# Patient Record
Sex: Female | Born: 1965 | Race: White | Hispanic: No | Marital: Married | Smoking: Never smoker
Health system: Southern US, Community
[De-identification: ages and names within clinical notes are randomized; demographics above are authoritative.]

## PROBLEM LIST (undated history)

## (undated) DIAGNOSIS — K219 Gastro-esophageal reflux disease without esophagitis: Secondary | ICD-10-CM

## (undated) DIAGNOSIS — F32A Depression, unspecified: Secondary | ICD-10-CM

## (undated) DIAGNOSIS — F329 Major depressive disorder, single episode, unspecified: Secondary | ICD-10-CM

## (undated) HISTORY — PX: OTHER SURGICAL HISTORY: SHX169

---

## 2007-09-24 ENCOUNTER — Encounter: Admission: RE | Admit: 2007-09-24 | Discharge: 2007-09-24 | Payer: Self-pay | Admitting: Obstetrics and Gynecology

## 2007-12-01 ENCOUNTER — Encounter: Admission: RE | Admit: 2007-12-01 | Discharge: 2007-12-01 | Payer: Self-pay | Admitting: Obstetrics and Gynecology

## 2008-02-12 ENCOUNTER — Encounter: Admission: RE | Admit: 2008-02-12 | Discharge: 2008-02-12 | Payer: Self-pay | Admitting: Obstetrics and Gynecology

## 2008-09-12 IMAGING — US US PELVIS COMPLETE
1 series · 13 of 25 positions shown · non-contrast
Comparison: none

DUPLICATE COPY for exam association in [facility] - no change from original report, 09/29/07.
CLINICAL DATA: Uterine fibroids.  Follow-up.
 TRANSABDOMINAL AND TRANSVAGINAL PELVIC ULTRASOUND:
TECHNIQUE: Both transabdominal and transvaginal ultrasound examinations of the pelvis were performed including evaluation of the uterus, ovaries, adnexal regions, and pelvic cul-de-sac.

[Series 1: us pelvis complete · 0.39mm/px · 13 of 59 slices shown]
[im 1/59]
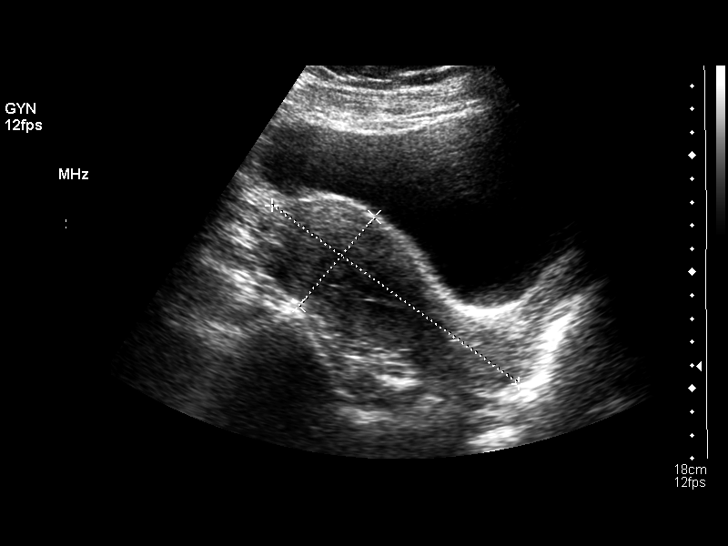
[im 5/59]
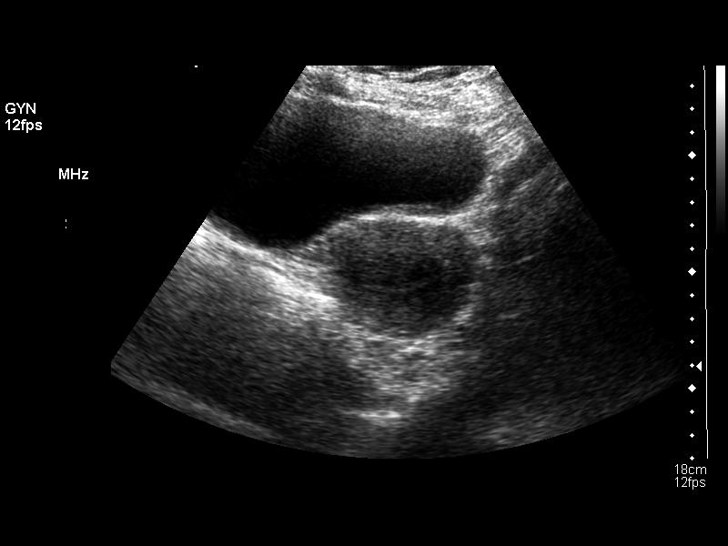
[im 10/59]
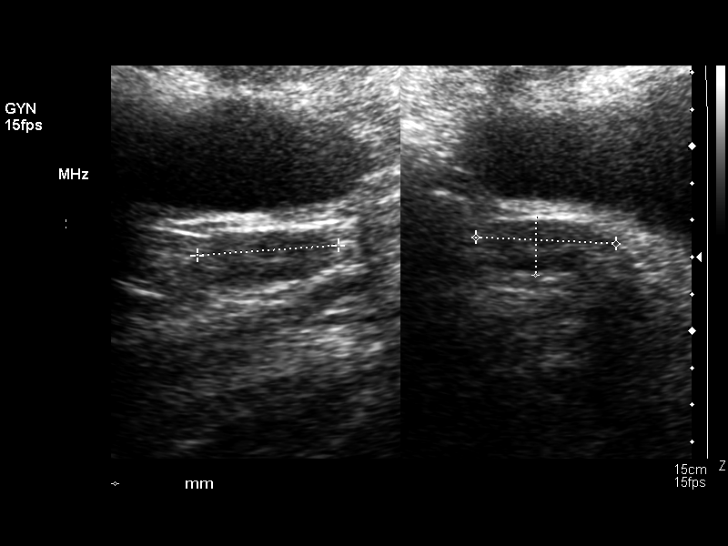
[im 15/59]
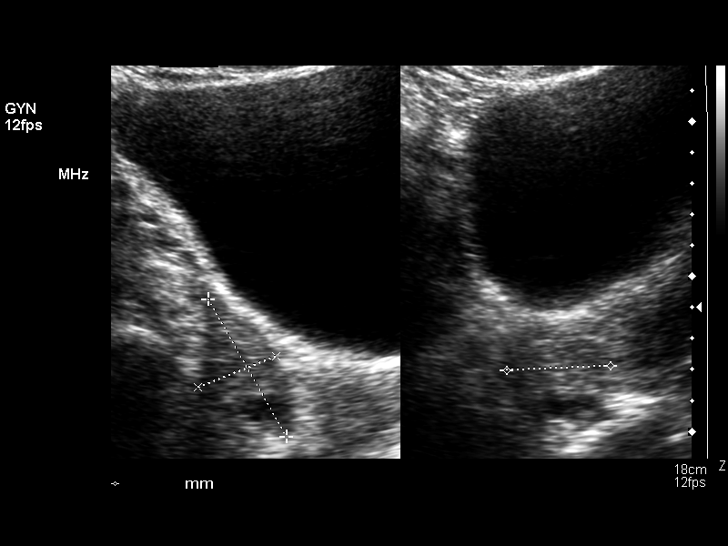
[im 20/59]
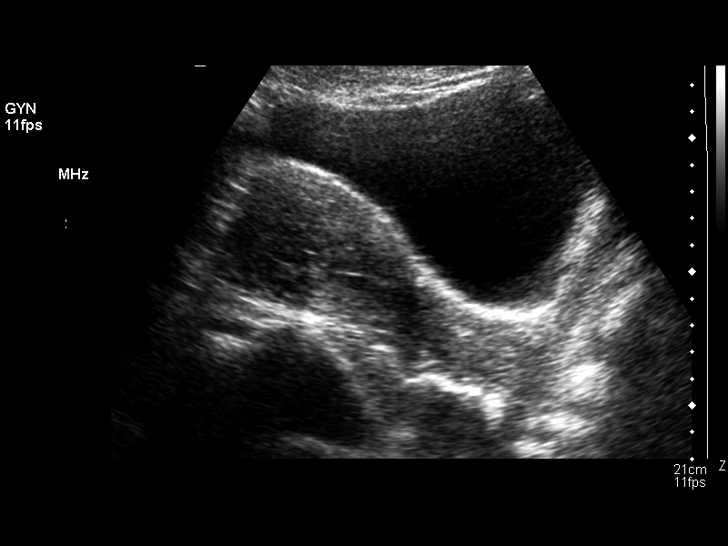
[im 25/59]
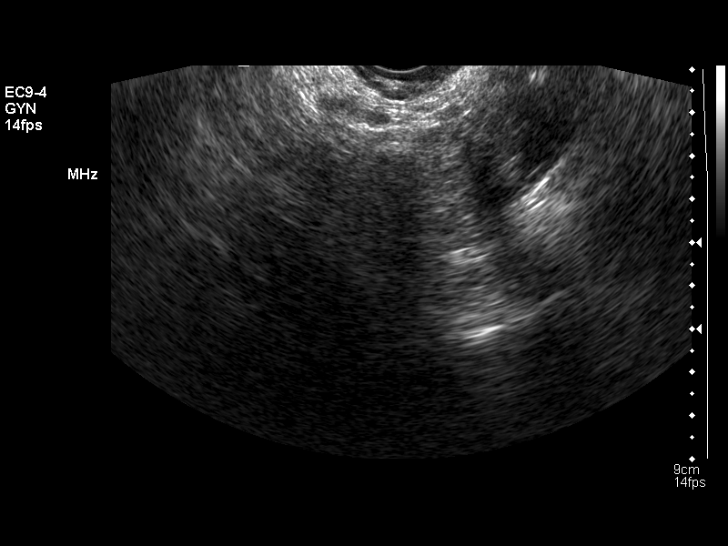
[im 30/59]
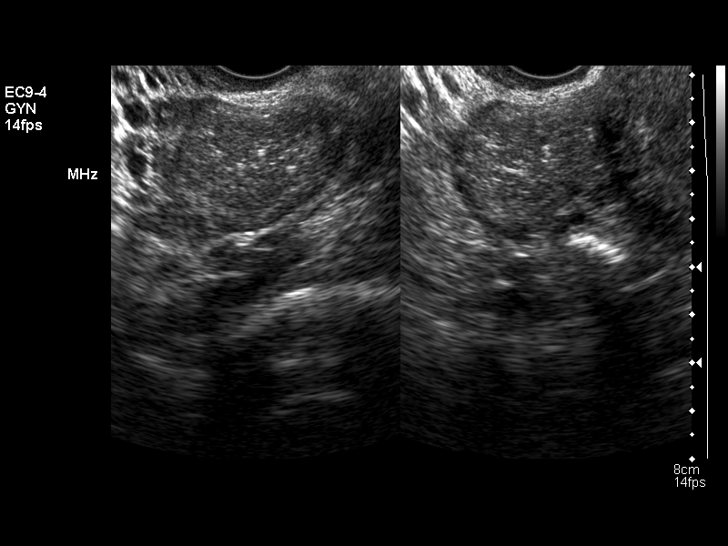
[im 34/59]
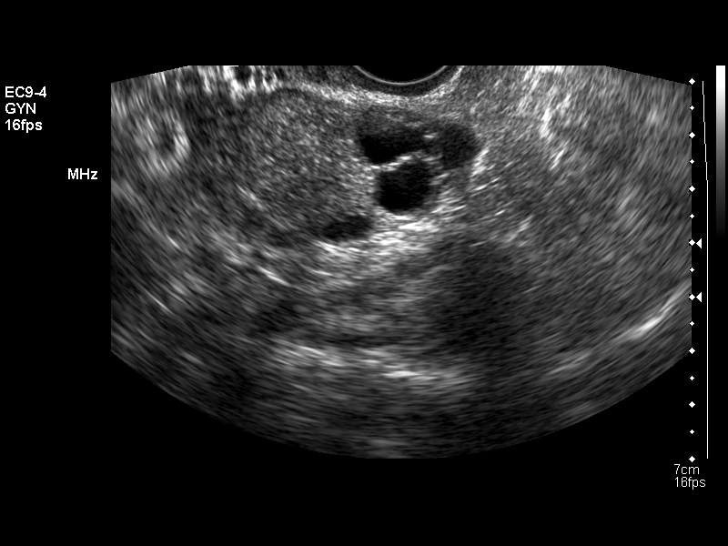
[im 39/59]
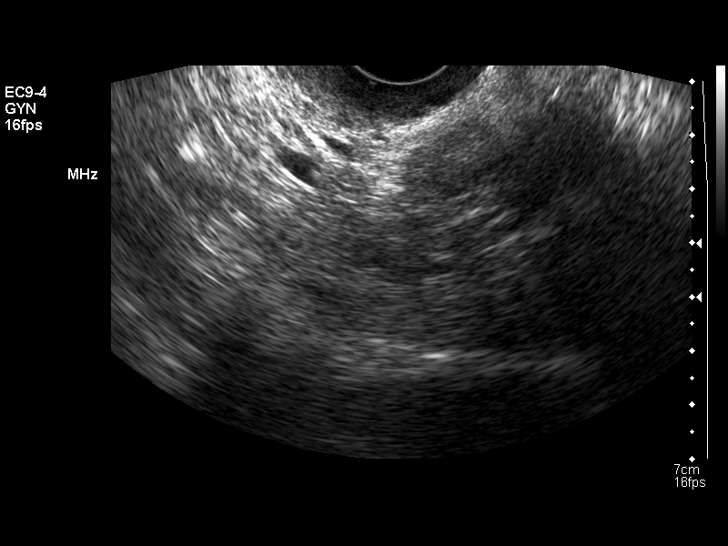
[im 44/59]
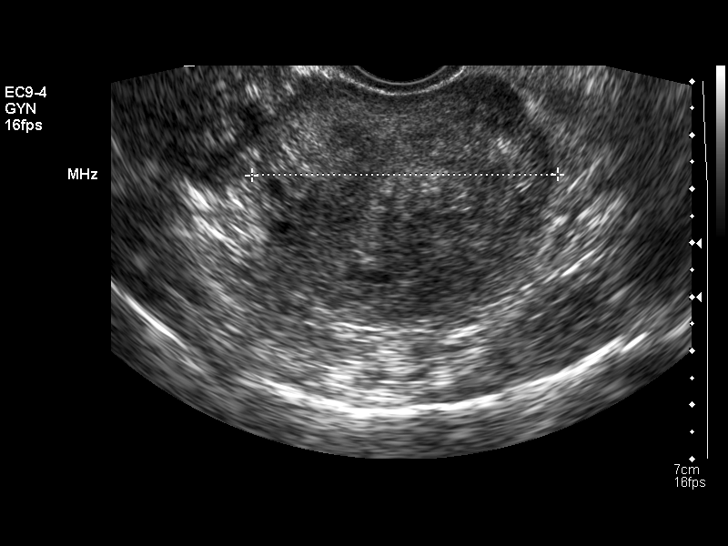
[im 49/59]
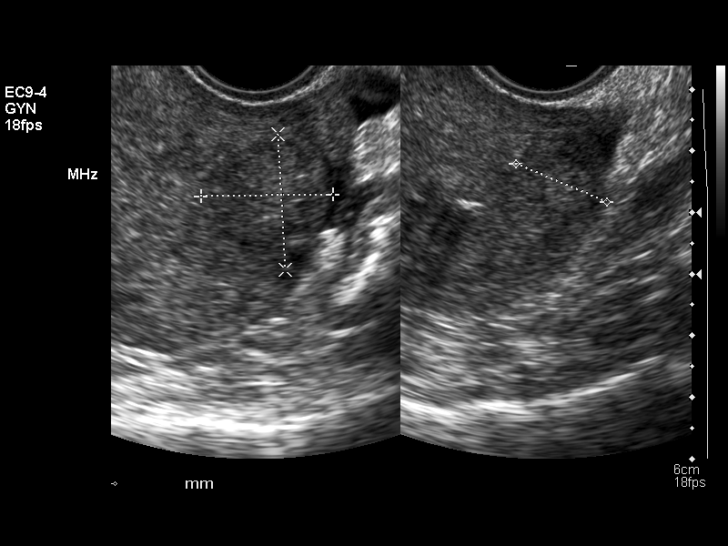
[im 54/59]
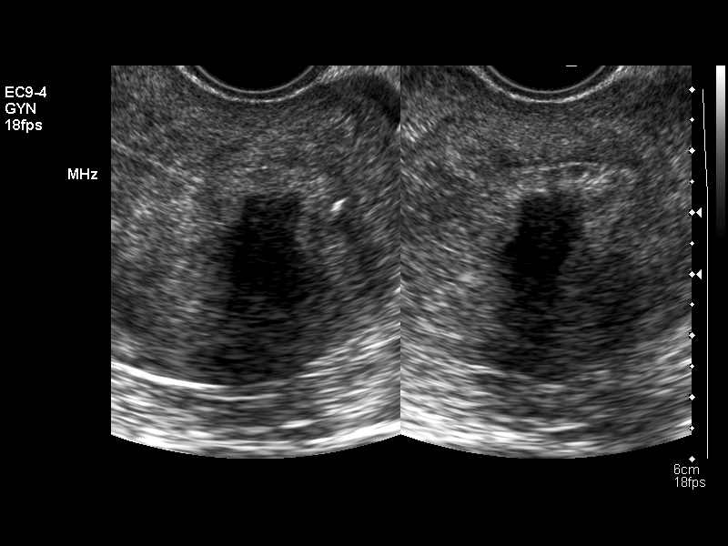
[im 59/59]
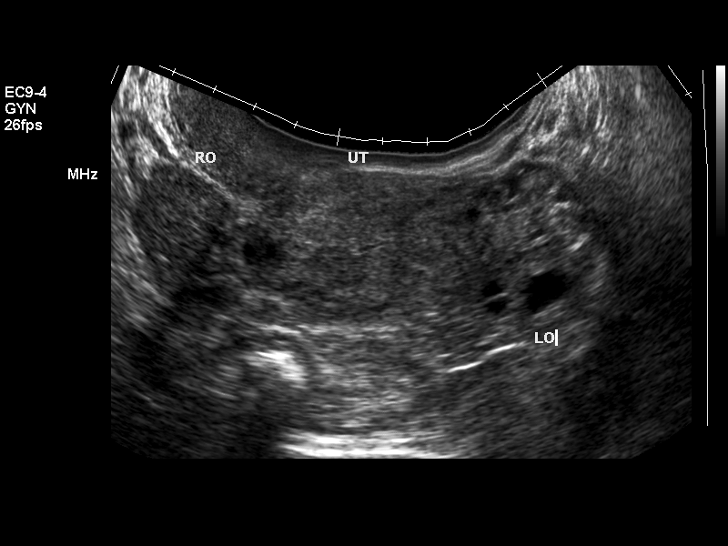

[13 of 25 positions shown; findings below may reference images not displayed]

FINDINGS: The uterus is retroverted measuring 8.3 cm sagittally with a depth of 5.5 cm and width of 5.7 cm.  The endometrium is normal measuring 2.4 mm in thickness.  There are two fibroids present.  The larger of which is posteriorly in the upper body - posterior fundus of 3.4 x 3.9 x 3.9 cm.  A second is located in the fundus slightly more anteriorly measuring 2.1 x 2.2 x 1.6 cm.  The right ovary is slightly more prominent, and there is a solid appearing complex mass emanating from the right ovary measuring 3.3 x 2.5 x 3.2 cm.  Follow-up ultrasound in two months is recommended.  Small ovarian follicles are noted.  Only a small amount of free fluid is present.
IMPRESSION: 1.  Two uterine fibroids within the fundus as noted above.  The endometrium appears normal.  
 2.  Solid appearing complex mass emanates from the right ovary of 3.3 x 2.5 x 3.2 cm.  Recommend follow-up ultrasound in two months.  Small amount of free fluid.

## 2008-11-19 IMAGING — US US TRANSVAGINAL NON-OB
1 series · 14 of 25 positions shown · non-contrast
Comparison: 09/24/07.

CLINICAL DATA: Assess uterine fibroids.  
 TRANSVAGINAL/TRANSABDOMINAL PELVIC ULTRASOUND:
TECHNIQUE: Both transabdominal and transvaginal ultrasound examinations of the pelvis were performed including evaluation of the uterus, ovaries, adnexal regions, and pelvic cul-de-sac.

[Series 1: us transvaginal non-ob · 0.37mm/px · 80 acquisitions, 14 frames shown]
[im 1/80]
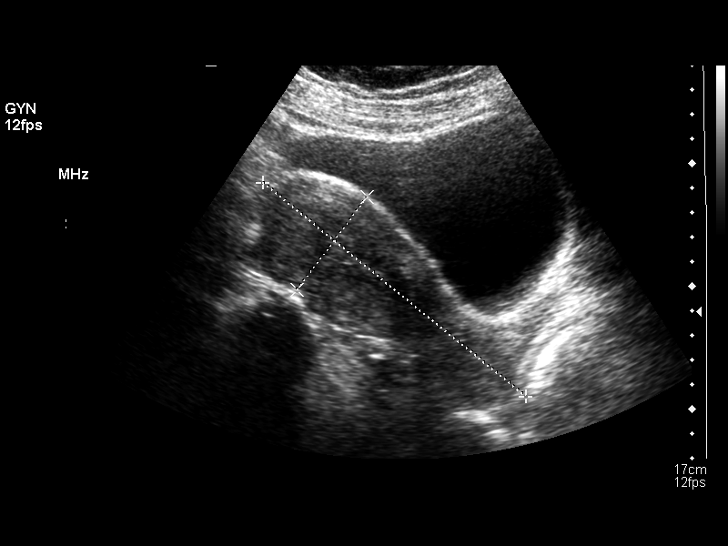
[im 7/80]
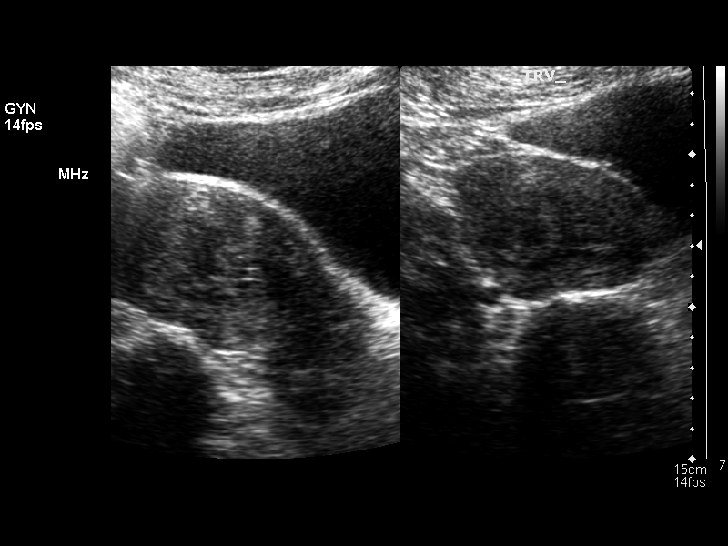
[im 14/80]
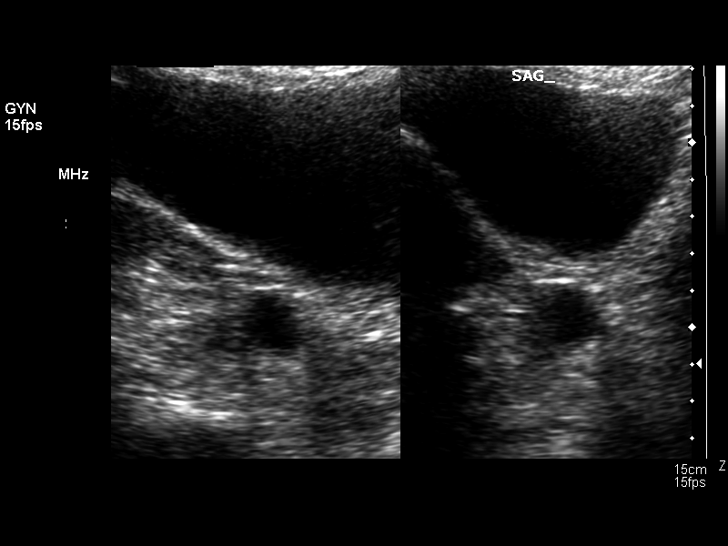
[im 20/80]
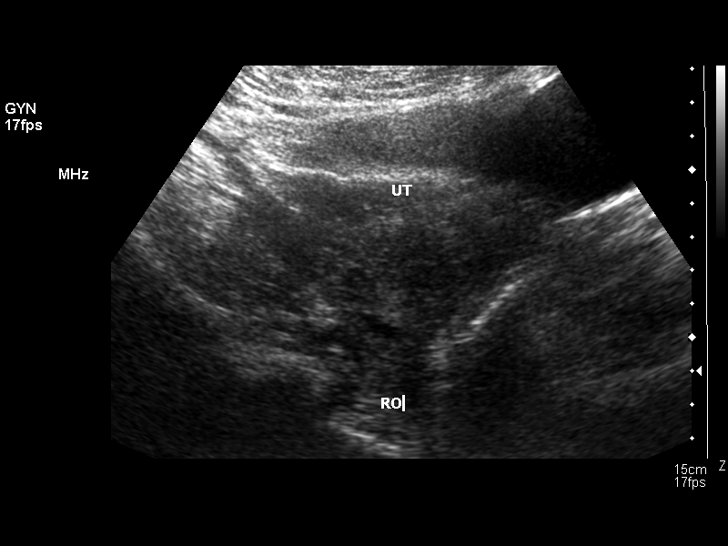
[im 27/80]
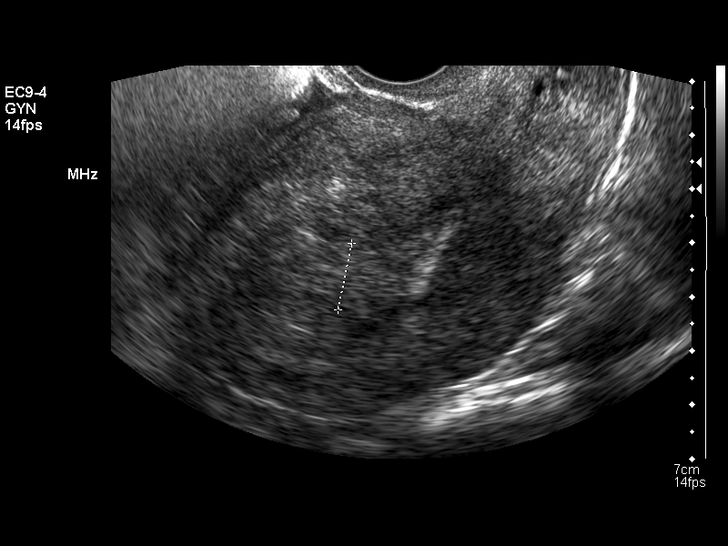
[im 30/80]
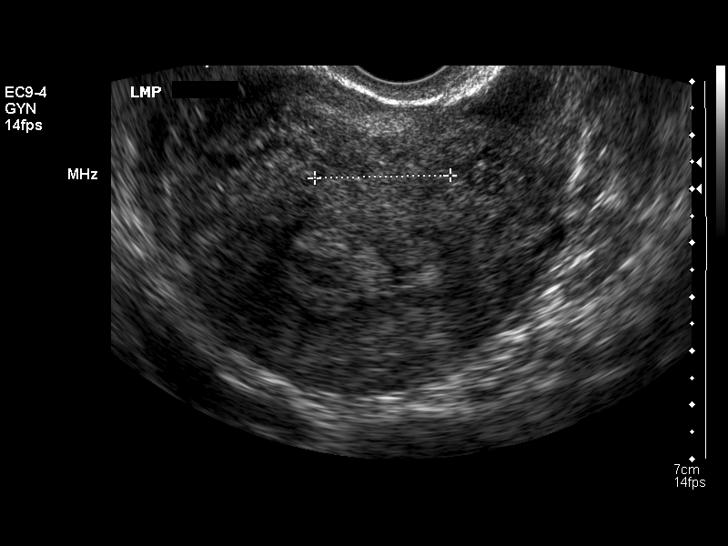
[im 37/80]
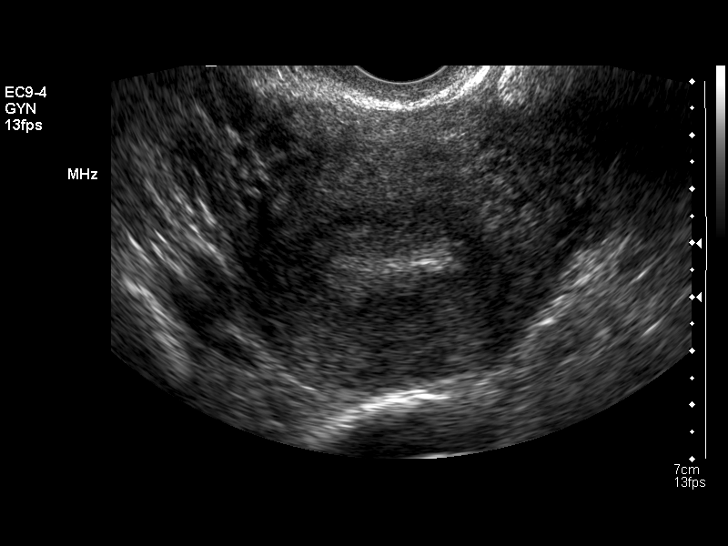
[im 43/80]
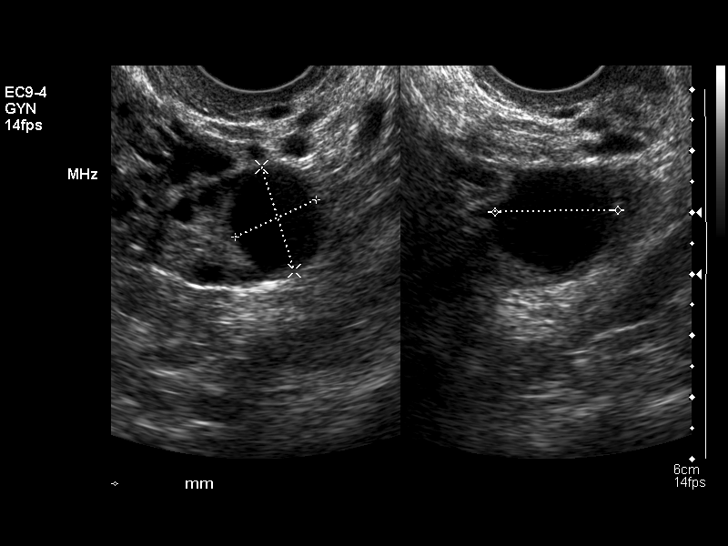
[im 50/80]
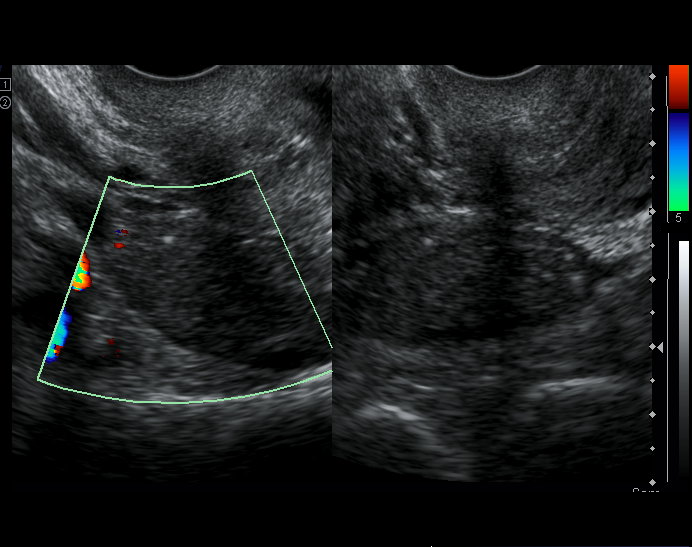
[im 53/80]
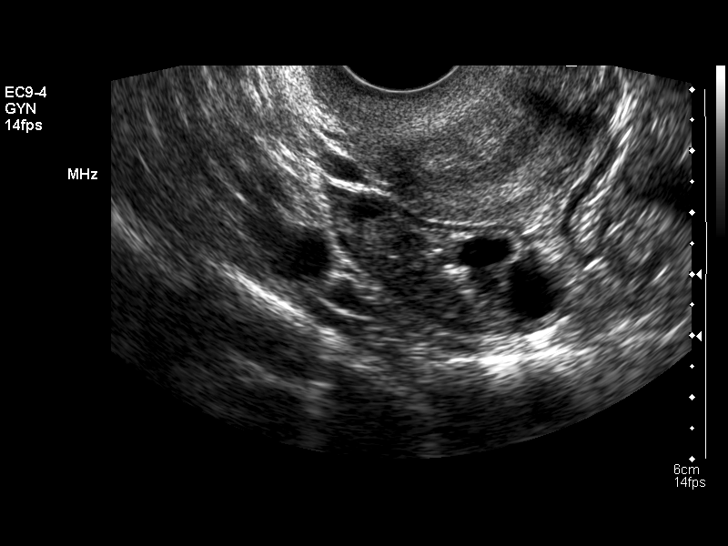
[im 60/80]
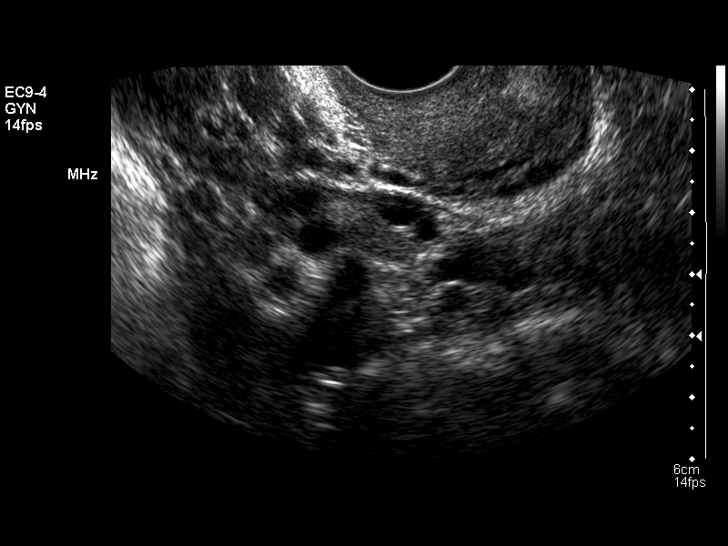
[im 66/80]
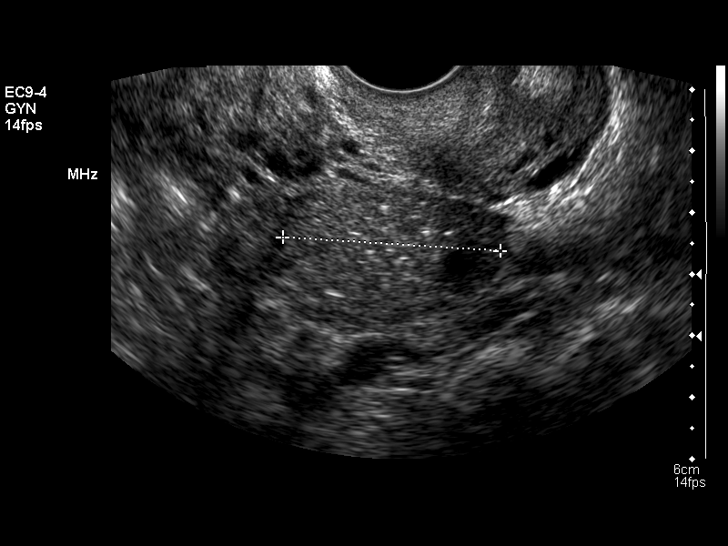
[im 73/80]
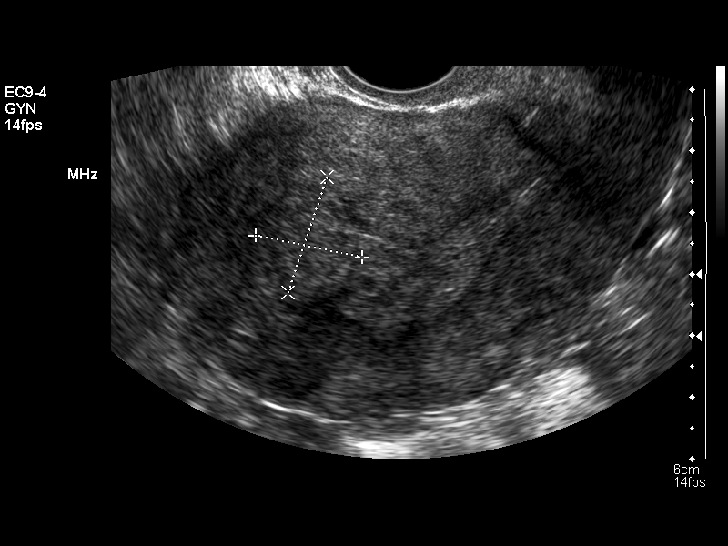
[im 80/80]
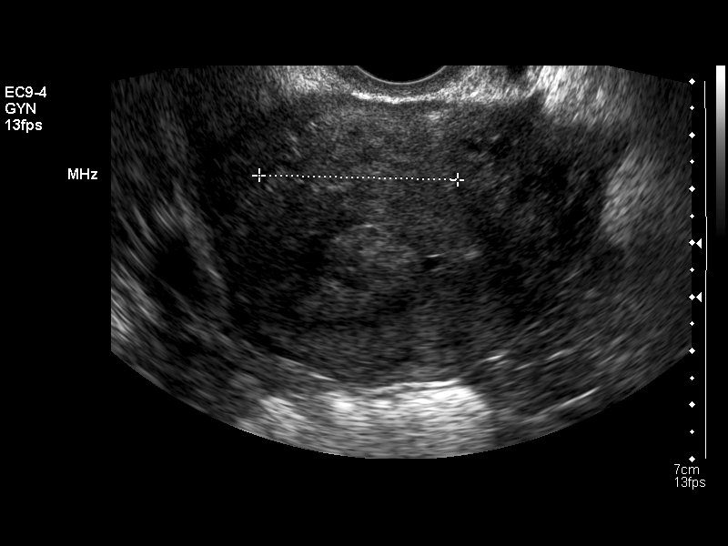

[14 of 25 positions shown; findings below may reference images not displayed]

FINDINGS: Previously noted retroverted uterus now appears anteverted.  The uterus measures 10.5cm in length, 4.7cm in AP dimension, and 7.2cm in length.  
 There are approximately three anterior fundal uterine fibroids identified.  The largest measures 4.9 x 2.5 x 3.7cm.  Within the posterior myometrium there is a fibroid which measures 2.3 x 2.8 x 2.1cm.  
 The endometrium is normal measuring 12.6mm.  
 Again seen is a solid appearing complex mass within the right ovary measuring 3 x 1.2 x 3.5cm.  On the previous exam this measured 3.3 x 2.5 x 3.2cm.  
 The left ovary is normal measuring 3.9 x 2.0 x 3.4cm.  There is no free fluid.
IMPRESSION: 1.  Fibroid uterus as described previously. 
 2.  Persistent solid appearing complex mass within the right ovary.  Contrast enhanced MR of the pelvis is suggested for a more definitive evaluation.

## 2009-02-14 ENCOUNTER — Encounter: Admission: RE | Admit: 2009-02-14 | Discharge: 2009-02-14 | Payer: Self-pay | Admitting: Obstetrics and Gynecology

## 2010-03-14 ENCOUNTER — Encounter: Admission: RE | Admit: 2010-03-14 | Discharge: 2010-03-14 | Payer: Self-pay | Admitting: Obstetrics and Gynecology

## 2011-03-09 ENCOUNTER — Other Ambulatory Visit: Payer: Self-pay | Admitting: *Deleted

## 2011-03-09 DIAGNOSIS — Z1231 Encounter for screening mammogram for malignant neoplasm of breast: Secondary | ICD-10-CM

## 2011-03-27 ENCOUNTER — Ambulatory Visit: Payer: Self-pay

## 2011-03-28 ENCOUNTER — Ambulatory Visit
Admission: RE | Admit: 2011-03-28 | Discharge: 2011-03-28 | Disposition: A | Discharge status: Home / Self Care | Payer: PRIVATE HEALTH INSURANCE | Source: Ambulatory Visit | Attending: *Deleted | Admitting: *Deleted

## 2011-03-28 DIAGNOSIS — Z1231 Encounter for screening mammogram for malignant neoplasm of breast: Secondary | ICD-10-CM

## 2012-03-12 ENCOUNTER — Other Ambulatory Visit: Payer: Self-pay | Admitting: *Deleted

## 2012-03-12 DIAGNOSIS — Z1231 Encounter for screening mammogram for malignant neoplasm of breast: Secondary | ICD-10-CM

## 2012-04-07 ENCOUNTER — Ambulatory Visit
Admission: RE | Admit: 2012-04-07 | Discharge: 2012-04-07 | Disposition: A | Discharge status: Home / Self Care | Payer: PRIVATE HEALTH INSURANCE | Source: Ambulatory Visit | Attending: *Deleted | Admitting: *Deleted

## 2012-04-07 DIAGNOSIS — Z1231 Encounter for screening mammogram for malignant neoplasm of breast: Secondary | ICD-10-CM

## 2013-12-21 ENCOUNTER — Encounter (HOSPITAL_COMMUNITY): Payer: Self-pay | Admitting: Emergency Medicine

## 2013-12-21 ENCOUNTER — Emergency Department (HOSPITAL_COMMUNITY)
Admission: EM | Admit: 2013-12-21 | Discharge: 2013-12-21 | Disposition: A | Discharge status: Home / Self Care | Payer: PRIVATE HEALTH INSURANCE | Source: Home / Self Care | Attending: Family Medicine | Admitting: Family Medicine

## 2013-12-21 DIAGNOSIS — W268XXA Contact with other sharp object(s), not elsewhere classified, initial encounter: Secondary | ICD-10-CM

## 2013-12-21 DIAGNOSIS — S61409A Unspecified open wound of unspecified hand, initial encounter: Secondary | ICD-10-CM

## 2013-12-21 DIAGNOSIS — S61419A Laceration without foreign body of unspecified hand, initial encounter: Secondary | ICD-10-CM

## 2013-12-21 DIAGNOSIS — Z23 Encounter for immunization: Secondary | ICD-10-CM

## 2013-12-21 MED ORDER — TETANUS-DIPHTH-ACELL PERTUSSIS 5-2.5-18.5 LF-MCG/0.5 IM SUSP
0.5000 mL | Freq: Once | INTRAMUSCULAR | Status: AC
  Administered 2013-12-21: 0.5 mL via INTRAMUSCULAR

## 2013-12-21 MED ORDER — TETANUS-DIPHTH-ACELL PERTUSSIS 5-2.5-18.5 LF-MCG/0.5 IM SUSP
INTRAMUSCULAR | Status: AC
  Filled 2013-12-21: qty 0.5

## 2013-12-21 NOTE — ED Notes (Signed)
Laceration to palm of left hand.  Patient was trying to put a picture frame together: the frame, glass, picture and cardboard, when they slid through hands .

## 2013-12-21 NOTE — ED Provider Notes (Signed)
Medical screening examination/treatment/procedure(s) were performed by resident physician or non-physician practitioner and as supervising physician I was immediately available for consultation/collaboration.   Jaleia Hanke DOUGLAS MD.   Gerianne Simonet D Alixandrea Milleson, MD 12/21/13 2034 

## 2013-12-21 NOTE — ED Notes (Signed)
Provided patient with yellow card/wallet card with tetanus info to keep in wallet

## 2013-12-21 NOTE — ED Provider Notes (Signed)
CSN: [id number]     Arrival date & time 12/21/13  1623 History   First MD Initiated Contact with Patient 12/21/13 1845     Chief Complaint  Patient presents with  . Laceration   (Consider location/radiation/quality/duration/timing/severity/associated sxs/prior Treatment)  HPI  Patient is a 48 year old female who states that she cut her palm of her left hand after dropping a picture with glass in it approximately 3 hours ago. Patient states "I wasn't sure if it needed stitches or not, but was concerned as I don't have a recent tetanus shot."  History reviewed. No pertinent past medical history. Past Surgical History  Procedure Laterality Date  . Cesarean section    . Fibroid tumor     No family history on file. History  Substance Use Topics  . Smoking status: Never Smoker   . Smokeless tobacco: Not on file  . Alcohol Use: Yes   OB History   Grav Para Term Preterm Abortions TAB SAB Ect Mult Living                 Review of Systems  Constitutional: Negative.   HENT: Negative.   Eyes: Negative.   Respiratory: Negative.   Cardiovascular: Negative.   Gastrointestinal: Negative.   Endocrine: Negative.   Genitourinary: Negative.   Musculoskeletal: Negative.   Skin: Positive for wound. Negative for color change, pallor and rash.  Allergic/Immunologic: Negative.   Neurological: Negative.   Hematological: Negative.   Psychiatric/Behavioral: Negative.     Allergies  Review of patient's allergies indicates no known allergies.  Home Medications   Current Outpatient Rx  Name  Route  Sig  Dispense  Refill  . sertraline (ZOLOFT) 25 MG tablet   Oral   Take 25 mg by mouth daily.          BP 190/81  Pulse 74  Temp(Src) 98.6 F (37 C) (Oral)  Resp 16  SpO2 100%  LMP 12/14/2013  Physical Exam  Nursing note and vitals reviewed. Constitutional: She appears well-developed and well-nourished. No distress.  Cardiovascular: Normal rate, regular rhythm, normal heart sounds  and intact distal pulses.  Exam reveals no gallop and no friction rub.   No murmur heard. Pulmonary/Chest: Effort normal and breath sounds normal. No respiratory distress. She has no wheezes. She has no rales. She exhibits no tenderness.  Musculoskeletal:       Hands: Skin: Skin is warm and dry. No rash noted. She is not diaphoretic. No erythema. No pallor.  The patient has an approximately 2.5 cm laceration to left palm. Good closure was able to be obtained through the use of Steri-Strips closure. Copious irrigation was done with normal saline and the wound explored. There is no foreign body or deep structure injury noted. The wound edges were approximated with good alignment using 5 Steri-Strips.  Simple gauze dressing was applied. The patient tolerated the procedure well without adverse effects.    ED Course  Procedures (including critical care time) Labs Review Labs Reviewed - No data to display Imaging Review No results found.   MDM   1. Laceration of hand    Meds ordered this encounter  Medications  . sertraline (ZOLOFT) 25 MG tablet    Sig: Take 25 mg by mouth daily.  . Tdap (BOOSTRIX) injection 0.5 mL    Sig:    The patient verbalizes understanding and agrees to plan of care.       Weber Cooksatherine Lashandra Arauz, NP 12/21/13 [phone]

## 2013-12-21 NOTE — ED Notes (Signed)
Unknown when last tetanus was received

## 2013-12-21 NOTE — Discharge Instructions (Signed)
Sterile Tape Wound Care  Some cuts and wounds can be closed using sterile tape, also called skin adhesive strips. Skin adhesive strips can be used for shallow (superficial) and simple cuts, wounds, lacerations, and surgical incisions. These strips act in place of stitches to hold the edges of the wound together, allowing for faster healing. Unlike stitches, the adhesive strips do not require needles or anesthetic medicine for placement. The strips will wear off naturally as the wound is healing. It is important to take proper care of your wound at home while it heals.   HOME CARE INSTRUCTIONS   Try to keep the area around your wound clean and dry. Do not allow the adhesive strips to get wet for the first 12 hours.    Do not use any soaps or ointments on the wound for the first 12 hours.    If a bandage (dressing) has been applied, follow your health care provider's instructions for how often to change the dressing. Keep the dressing dry if one has been applied.    Do not remove the adhesive strips. They will fall off on their own. If they do not, you may remove them gently after 10 days. You should gently wet the strips before removing them. For example, this can be done in the shower.   Do not scratch, pick, or rub the wound area.    Protect the wound from further injury until it is healed.    Protect the wound from sun and tanning bed exposure while it is healing and for several weeks after healing.    Only take over-the-counter or prescription medicines as directed by your health care provider.    Keep all follow-up appointments as directed by your health care provider.   SEEK MEDICAL CARE IF:  Your adhesive strips become wet or soaked with blood before the wound has healed. The tape will need to be replaced.   SEEK IMMEDIATE MEDICAL CARE IF:   You have increasing pain in the wound.    You develop a rash after the strips are applied.   Your wound becomes red, swollen, hot, or tender.    You  have a red streak that goes away from the wound.    You have pus coming from the wound.    You have increased bleeding from the wound.   You notice a bad smell coming from the wound.    Your wound breaks open.  MAKE SURE YOU:   Understand these instructions.   Will watch your condition.   Will get help right away if you are not doing well or get worse.  Document Released: 11/08/2004 Document Revised: 07/22/2013 Document Reviewed: 04/22/2013  ExitCare Patient Information 2014 [employer].

## 2013-12-30 ENCOUNTER — Other Ambulatory Visit: Payer: Self-pay

## 2013-12-30 DIAGNOSIS — Z1231 Encounter for screening mammogram for malignant neoplasm of breast: Secondary | ICD-10-CM

## 2013-12-30 DIAGNOSIS — Z803 Family history of malignant neoplasm of breast: Secondary | ICD-10-CM

## 2014-01-11 ENCOUNTER — Ambulatory Visit: Payer: PRIVATE HEALTH INSURANCE

## 2014-01-19 ENCOUNTER — Ambulatory Visit
Admission: RE | Admit: 2014-01-19 | Discharge: 2014-01-19 | Disposition: A | Discharge status: Home / Self Care | Payer: PRIVATE HEALTH INSURANCE | Source: Ambulatory Visit

## 2014-01-19 DIAGNOSIS — Z803 Family history of malignant neoplasm of breast: Secondary | ICD-10-CM

## 2014-01-19 DIAGNOSIS — Z1231 Encounter for screening mammogram for malignant neoplasm of breast: Secondary | ICD-10-CM

## 2014-12-10 ENCOUNTER — Other Ambulatory Visit: Payer: Self-pay

## 2014-12-10 DIAGNOSIS — Z1231 Encounter for screening mammogram for malignant neoplasm of breast: Secondary | ICD-10-CM

## 2015-02-14 ENCOUNTER — Ambulatory Visit: Payer: PRIVATE HEALTH INSURANCE

## 2015-03-18 ENCOUNTER — Ambulatory Visit
Admission: RE | Admit: 2015-03-18 | Discharge: 2015-03-18 | Disposition: A | Discharge status: Home / Self Care | Payer: PRIVATE HEALTH INSURANCE | Source: Ambulatory Visit

## 2015-03-18 DIAGNOSIS — Z1231 Encounter for screening mammogram for malignant neoplasm of breast: Secondary | ICD-10-CM

## 2015-06-29 ENCOUNTER — Telehealth: Payer: Self-pay

## 2015-07-05 ENCOUNTER — Other Ambulatory Visit: Payer: Self-pay

## 2015-07-05 DIAGNOSIS — Z1211 Encounter for screening for malignant neoplasm of colon: Secondary | ICD-10-CM

## 2015-07-05 NOTE — Telephone Encounter (Signed)
Gastroenterology Pre-Procedure Review  Request Date: 06/29/2015 Requesting Physician: Dr. Dwana Melena  PATIENT REVIEW QUESTIONS: The patient responded to the following health history questions as indicated:    Pt requested Dr. Jena Gauss, forwarding to Tana Coast, [place] to review.   1. Diabetes Melitis: no 2. Joint replacements in the past 12 months: no 3. Major health problems in the past 3 months: no 4. Has an artificial valve or MVP: no 5. Has a defibrillator: no 6. Has been advised in past to take antibiotics in advance of a procedure like teeth cleaning: no    MEDICATIONS & ALLERGIES:    Patient reports the following regarding taking any blood thinners:   Plavix? no Aspirin? no Coumadin? no  Patient confirms/reports the following medications:  Current Outpatient Prescriptions  Medication Sig Dispense Refill  . omeprazole (PRILOSEC) 20 MG capsule Take 20 mg by mouth daily.    . sertraline (ZOLOFT) 25 MG tablet Take 25 mg by mouth daily.     No current facility-administered medications for this visit.    Patient confirms/reports the following allergies:  No Known Allergies  No orders of the defined types were placed in this encounter.    AUTHORIZATION INFORMATION Primary Insurance:   ID #:   Group #:  Pre-Cert / Auth required:  Pre-Cert / Auth #:   Secondary Insurance:   ID #:  Group #:  Pre-Cert / Auth required:  Pre-Cert / Auth #:   SCHEDULE INFORMATION: Procedure has been scheduled as follows:  Date: 07/21/2015                 Time: 12:30 PM  Location: [facility] Short Stay  This Gastroenterology Pre-Precedure Review Form is being routed to the following provider(s): Jonette Eva, MD  PT requested Dr. Jena Gauss, routing to Tana Coast, [place] to review.

## 2015-07-05 NOTE — Telephone Encounter (Signed)
Sorry, Verlon Au, this should have come to you. Pt requested Dr. Jena Gauss.

## 2015-07-05 NOTE — Telephone Encounter (Signed)
SUPREP SPLIT DOSING- CLEAR LIQUIDS WITH BREAKFAST.  

## 2015-07-06 NOTE — Telephone Encounter (Signed)
OK to schedule. Please use RMR's standard split prep.

## 2015-07-07 MED ORDER — PEG 3350-KCL-NA BICARB-NACL 420 G PO SOLR: 4000.0000 mL | ORAL | Status: AC

## 2015-07-07 NOTE — Telephone Encounter (Signed)
Rx sent to the pharmacy and instructions mailed to pt.  

## 2015-07-18 ENCOUNTER — Telehealth: Payer: Self-pay

## 2015-07-18 NOTE — Telephone Encounter (Signed)
The precert # [id number] for the screening colonoscopy  From [employer].  Copy was scanned.

## 2015-07-18 NOTE — Telephone Encounter (Signed)
Pt TCS has been moved up ton 07/20/15 and her insurance is aware of the date of service change.

## 2015-07-20 ENCOUNTER — Ambulatory Visit (HOSPITAL_COMMUNITY)
Admission: RE | Admit: 2015-07-20 | Discharge: 2015-07-20 | Disposition: A | Discharge status: Home / Self Care | Payer: PRIVATE HEALTH INSURANCE | Source: Ambulatory Visit | Attending: Internal Medicine | Admitting: Internal Medicine

## 2015-07-20 ENCOUNTER — Encounter (HOSPITAL_COMMUNITY): Payer: Self-pay

## 2015-07-20 ENCOUNTER — Encounter (HOSPITAL_COMMUNITY)
Admission: RE | Disposition: A | Discharge status: Home / Self Care | Payer: Self-pay | Source: Ambulatory Visit | Attending: Internal Medicine

## 2015-07-20 DIAGNOSIS — F329 Major depressive disorder, single episode, unspecified: Secondary | ICD-10-CM | POA: Insufficient documentation

## 2015-07-20 DIAGNOSIS — Z1211 Encounter for screening for malignant neoplasm of colon: Secondary | ICD-10-CM | POA: Insufficient documentation

## 2015-07-20 DIAGNOSIS — K219 Gastro-esophageal reflux disease without esophagitis: Secondary | ICD-10-CM | POA: Insufficient documentation

## 2015-07-20 DIAGNOSIS — Z79899 Other long term (current) drug therapy: Secondary | ICD-10-CM | POA: Diagnosis not present

## 2015-07-20 HISTORY — DX: Gastro-esophageal reflux disease without esophagitis: K21.9

## 2015-07-20 HISTORY — DX: Depression, unspecified: F32.A

## 2015-07-20 HISTORY — DX: Major depressive disorder, single episode, unspecified: F32.9

## 2015-07-20 HISTORY — PX: COLONOSCOPY: SHX5424

## 2015-07-20 SURGERY — COLONOSCOPY
Anesthesia: Moderate Sedation

## 2015-07-20 MED ORDER — MEPERIDINE HCL 100 MG/ML IJ SOLN
INTRAMUSCULAR | Status: AC
  Filled 2015-07-20: qty 2

## 2015-07-20 MED ORDER — SODIUM CHLORIDE 0.9 % IV SOLN
INTRAVENOUS | Status: DC
  Administered 2015-07-20: 08:00:00 via INTRAVENOUS

## 2015-07-20 MED ORDER — STERILE WATER FOR IRRIGATION IR SOLN
Status: DC | PRN
  Administered 2015-07-20: 09:00:00

## 2015-07-20 MED ORDER — LIDOCAINE VISCOUS 2 % MT SOLN
OROMUCOSAL | Status: AC
  Filled 2015-07-20: qty 15

## 2015-07-20 MED ORDER — MIDAZOLAM HCL 5 MG/5ML IJ SOLN
INTRAMUSCULAR | Status: DC | PRN
  Administered 2015-07-20: 1 mg via INTRAVENOUS
  Administered 2015-07-20: 2 mg via INTRAVENOUS
  Administered 2015-07-20: 1 mg via INTRAVENOUS
  Administered 2015-07-20: 2 mg via INTRAVENOUS

## 2015-07-20 MED ORDER — MIDAZOLAM HCL 5 MG/5ML IJ SOLN
INTRAMUSCULAR | Status: AC
  Filled 2015-07-20: qty 10

## 2015-07-20 MED ORDER — ONDANSETRON HCL 4 MG/2ML IJ SOLN
INTRAMUSCULAR | Status: DC | PRN
  Administered 2015-07-20: 4 mg via INTRAVENOUS

## 2015-07-20 MED ORDER — MEPERIDINE HCL 100 MG/ML IJ SOLN
INTRAMUSCULAR | Status: DC | PRN
  Administered 2015-07-20: 50 mg via INTRAVENOUS
  Administered 2015-07-20 (×2): 25 mg via INTRAVENOUS

## 2015-07-20 MED ORDER — ONDANSETRON HCL 4 MG/2ML IJ SOLN
INTRAMUSCULAR | Status: AC
  Filled 2015-07-20: qty 2

## 2015-07-20 NOTE — H&P (Signed)
@  [name]   Primary Care Physician:  No PCP Per Patient Primary Gastroenterologist:  Dr. Jena Gauss  Pre-Procedure History & Physical: HPI:  Denise West is a 49 y.o. female is here for a screening colonoscopy. No bowel symptoms. No family history of colon cancer. No prior colonoscopy.  Past Medical History  Diagnosis Date  . Depression   . GERD (gastroesophageal reflux disease)     Past Surgical History  Procedure Laterality Date  . Cesarean section    . Fibroid tumor      Prior to Admission medications   Medication Sig Start Date End Date Taking? Authorizing Provider  cetirizine (ZYRTEC) 10 MG tablet Take 10 mg by mouth daily as needed for allergies.   Yes Historical Provider, MD  ibuprofen (ADVIL,MOTRIN) 200 MG tablet Take 200 mg by mouth every 6 (six) hours as needed for moderate pain.   Yes Historical Provider, MD  Multiple Vitamins-Minerals (MULTIVITAMINS THER. W/MINERALS) TABS tablet Take 1 tablet by mouth daily.   Yes Historical Provider, MD  omeprazole (PRILOSEC) 20 MG capsule Take 20 mg by mouth daily.   Yes Historical Provider, MD  polyethylene glycol-electrolytes (TRILYTE) 420 G solution Take 4,000 mLs by mouth as directed. 07/07/15  Yes Corbin Ade, MD  sertraline (ZOLOFT) 25 MG tablet Take 25 mg by mouth daily.   Yes Historical Provider, MD    Allergies as of 07/05/2015  . (No Known Allergies)    History reviewed. No pertinent family history.  Social History   Social History  . Marital Status: Married    Spouse Name: N/A  . Number of Children: N/A  . Years of Education: N/A   Occupational History  . Not on file.   Social History Main Topics  . Smoking status: Never Smoker   . Smokeless tobacco: Not on file  . Alcohol Use: Yes  . Drug Use: No  . Sexual Activity: Not on file   Other Topics Concern  . Not on file   Social History Narrative    Review of Systems: See HPI, otherwise negative ROS  Physical Exam: BP 140/74 mmHg  Pulse 69   Temp(Src) 98.7 F (37.1 C) (Oral)  Resp 15  Ht  (1.727 m)  Wt 270 lb (122.471 kg)  BMI 41.06 kg/m2  SpO2 9%  LMP 07/11/2015 General:   Alert,  Well-developed, well-nourished, pleasant and cooperative in NAD Head:  Normocephalic and atraumatic. Neck:  Supple; no masses or thyromegaly. Lungs:  Clear throughout to auscultation.   No wheezes, crackles, or rhonchi. No acute distress. Heart:  Regular rate and rhythm; no murmurs, clicks, rubs,  or gallops. Abdomen:  Soft, nontender and nondistended. No masses, hepatosplenomegaly or hernias noted. Normal bowel sounds, without guarding, and without rebound.   Pulses:  Normal pulses noted. Extremities:  Without clubbing or edema.  Impression/Plan: Denise West is now here to undergo a screening colonoscopy.  First ever average her screening examination Risks, benefits, limitations, imponderables and alternatives regarding colonoscopy have been reviewed with the patient. Questions have been answered. All parties agreeable.     Notice:  This dictation was prepared with Dragon dictation along with smaller phrase technology. Any transcriptional errors that result from this process are unintentional and may not be corrected upon review.

## 2015-07-20 NOTE — Op Note (Signed)
[facility] [address] [place], [address]   COLONOSCOPY PROCEDURE REPORT  PATIENT: Denise West, Denise West  MR#: [id number] BIRTHDATE: January 25, 1966 , 48  yrs. old GENDER: female ENDOSCOPIST: R.  Roetta Sessions, MD FACP [facility] REFERRED AO:ZHYQ Hall, M.D. PROCEDURE DATE:  08/10/2015 PROCEDURE:   Colonoscopy, screening INDICATIONS:First ever average risk colorectal cancer screening examination. MEDICATIONS: Versed 6 mg IV and Demerol 100 mg IV in divided doses. Zofran 4 mg IV. ASA CLASS:       Class II  CONSENT: The risks, benefits, alternatives and imponderables including but not limited to bleeding, perforation as well as the possibility of a missed lesion have been reviewed.  The potential for biopsy, lesion removal, etc. have also been discussed. Questions have been answered.  All parties agreeable.  Please see the history and physical in the medical record for more information.  DESCRIPTION OF PROCEDURE:   After the risks benefits and alternatives of the procedure were thoroughly explained, informed consent was obtained.  The digital rectal exam revealed no abnormalities of the rectum.   The EC-[id number] ([id number])  endoscope was introduced through the anus and advanced to the cecum, which was identified by both the appendix and ileocecal valve. No adverse events experienced.   The quality of the prep was adequate  The instrument was then slowly withdrawn as the colon was fully examined. Estimated blood loss is zero unless otherwise noted in this procedure report.      COLON FINDINGS: Normal-appearing rectal mucosa.  Normal-appearing colonic mucosa.  Retroflexion was performed. .  Withdrawal time=10 minutes 0 seconds.  The scope was withdrawn and the procedure completed. COMPLICATIONS: There were no immediate complications.  ENDOSCOPIC IMPRESSION: Normal colonoscopy  RECOMMENDATIONS: Repeat screening examination in 10 years  eSigned:  R. Roetta Sessions, MD  FACP [facility] 10-Aug-2015 9:02 AM   cc:  CPT CODES: ICD CODES:  The ICD and CPT codes recommended by this software are interpretations from the data that the clinical staff has captured with the software.  The verification of the translation of this report to the ICD and CPT codes and modifiers is the sole responsibility of the health care institution and practicing physician where this report was generated.  [employer] will not be held responsible for the validity of the ICD and CPT codes included on this report.  AMA assumes no liability for data contained or not contained herein. CPT is a Publishing rights manager of the [employer].

## 2015-07-20 NOTE — Discharge Instructions (Signed)
°  Colonoscopy Discharge Instructions  Read the instructions outlined below and refer to this sheet in the next few weeks. These discharge instructions provide you with general information on caring for yourself after you leave the hospital. Your doctor may also give you specific instructions. While your treatment has been planned according to the most current medical practices available, unavoidable complications occasionally occur. If you have any problems or questions after discharge, call Dr. Jena Gauss at [phone]. ACTIVITY  You may resume your regular activity, but move at a slower pace for the next 24 hours.   Take frequent rest periods for the next 24 hours.   Walking will help get rid of the air and reduce the bloated feeling in your belly (abdomen).   No driving for 24 hours (because of the medicine (anesthesia) used during the test).    Do not sign any important legal documents or operate any machinery for 24 hours (because of the anesthesia used during the test).  NUTRITION  Drink plenty of fluids.   You may resume your normal diet as instructed by your doctor.   Begin with a light meal and progress to your normal diet. Heavy or fried foods are harder to digest and may make you feel sick to your stomach (nauseated).   Avoid alcoholic beverages for 24 hours or as instructed.  MEDICATIONS  You may resume your normal medications unless your doctor tells you otherwise.  WHAT YOU CAN EXPECT TODAY  Some feelings of bloating in the abdomen.   Passage of more gas than usual.   Spotting of blood in your stool or on the toilet paper.  IF YOU HAD POLYPS REMOVED DURING THE COLONOSCOPY:  No aspirin products for 7 days or as instructed.   No alcohol for 7 days or as instructed.   Eat a soft diet for the next 24 hours.  FINDING OUT THE RESULTS OF YOUR TEST Not all test results are available during your visit. If your test results are not back during the visit, make an appointment  with your caregiver to find out the results. Do not assume everything is normal if you have not heard from your caregiver or the medical facility. It is important for you to follow up on all of your test results.  SEEK IMMEDIATE MEDICAL ATTENTION IF:  You have more than a spotting of blood in your stool.   Your belly is swollen (abdominal distention).   You are nauseated or vomiting.   You have a temperature over 101.   You have abdominal pain or discomfort that is severe or gets worse throughout the day.    Your colonoscopy was normal today  I recommend a repeat colonoscopy in 10 years

## 2015-07-21 ENCOUNTER — Ambulatory Visit: Admit: 2015-07-21 | Payer: PRIVATE HEALTH INSURANCE | Admitting: Gastroenterology

## 2015-07-21 SURGERY — COLONOSCOPY
Anesthesia: Moderate Sedation

## 2015-07-26 ENCOUNTER — Encounter (HOSPITAL_COMMUNITY): Payer: Self-pay | Admitting: Internal Medicine

## 2016-01-04 ENCOUNTER — Ambulatory Visit: Payer: PRIVATE HEALTH INSURANCE | Admitting: Psychology

## 2016-01-17 ENCOUNTER — Ambulatory Visit (INDEPENDENT_AMBULATORY_CARE_PROVIDER_SITE_OTHER): Payer: PRIVATE HEALTH INSURANCE | Admitting: Psychology

## 2016-01-17 DIAGNOSIS — F4323 Adjustment disorder with mixed anxiety and depressed mood: Secondary | ICD-10-CM

## 2016-01-19 ENCOUNTER — Ambulatory Visit (INDEPENDENT_AMBULATORY_CARE_PROVIDER_SITE_OTHER): Payer: PRIVATE HEALTH INSURANCE | Admitting: Psychology

## 2016-01-19 DIAGNOSIS — F4323 Adjustment disorder with mixed anxiety and depressed mood: Secondary | ICD-10-CM

## 2016-02-02 ENCOUNTER — Ambulatory Visit (INDEPENDENT_AMBULATORY_CARE_PROVIDER_SITE_OTHER): Payer: PRIVATE HEALTH INSURANCE | Admitting: Psychology

## 2016-02-02 DIAGNOSIS — F4320 Adjustment disorder, unspecified: Secondary | ICD-10-CM

## 2016-02-29 ENCOUNTER — Other Ambulatory Visit: Payer: Self-pay

## 2016-02-29 DIAGNOSIS — Z1231 Encounter for screening mammogram for malignant neoplasm of breast: Secondary | ICD-10-CM

## 2016-03-20 ENCOUNTER — Ambulatory Visit: Payer: Self-pay

## 2016-03-27 ENCOUNTER — Ambulatory Visit: Payer: Self-pay

## 2016-04-02 ENCOUNTER — Ambulatory Visit
Admission: RE | Admit: 2016-04-02 | Discharge: 2016-04-02 | Disposition: A | Discharge status: Home / Self Care | Payer: PRIVATE HEALTH INSURANCE | Source: Ambulatory Visit

## 2016-04-02 DIAGNOSIS — Z1231 Encounter for screening mammogram for malignant neoplasm of breast: Secondary | ICD-10-CM

## 2016-11-29 ENCOUNTER — Ambulatory Visit (INDEPENDENT_AMBULATORY_CARE_PROVIDER_SITE_OTHER): Payer: PRIVATE HEALTH INSURANCE | Admitting: Psychology

## 2016-11-29 DIAGNOSIS — F4323 Adjustment disorder with mixed anxiety and depressed mood: Secondary | ICD-10-CM | POA: Diagnosis not present

## 2016-12-13 ENCOUNTER — Ambulatory Visit (INDEPENDENT_AMBULATORY_CARE_PROVIDER_SITE_OTHER): Payer: PRIVATE HEALTH INSURANCE | Admitting: Psychology

## 2016-12-13 DIAGNOSIS — F4323 Adjustment disorder with mixed anxiety and depressed mood: Secondary | ICD-10-CM | POA: Diagnosis not present

## 2017-01-14 ENCOUNTER — Ambulatory Visit (INDEPENDENT_AMBULATORY_CARE_PROVIDER_SITE_OTHER): Payer: PRIVATE HEALTH INSURANCE | Admitting: Psychology

## 2017-01-14 DIAGNOSIS — F4323 Adjustment disorder with mixed anxiety and depressed mood: Secondary | ICD-10-CM

## 2017-02-04 ENCOUNTER — Ambulatory Visit: Payer: PRIVATE HEALTH INSURANCE | Admitting: Psychology

## 2017-02-27 ENCOUNTER — Ambulatory Visit: Payer: PRIVATE HEALTH INSURANCE | Admitting: Psychology

## 2017-09-09 ENCOUNTER — Other Ambulatory Visit: Payer: Self-pay | Admitting: Family Medicine

## 2017-09-09 DIAGNOSIS — Z139 Encounter for screening, unspecified: Secondary | ICD-10-CM

## 2017-10-01 ENCOUNTER — Other Ambulatory Visit: Payer: Self-pay | Admitting: Family Medicine

## 2017-10-01 DIAGNOSIS — Z1231 Encounter for screening mammogram for malignant neoplasm of breast: Secondary | ICD-10-CM

## 2017-10-30 ENCOUNTER — Ambulatory Visit
Admission: RE | Admit: 2017-10-30 | Discharge: 2017-10-30 | Disposition: A | Discharge status: Home / Self Care | Payer: PRIVATE HEALTH INSURANCE | Source: Ambulatory Visit | Attending: Family Medicine | Admitting: Family Medicine

## 2017-10-30 DIAGNOSIS — Z1231 Encounter for screening mammogram for malignant neoplasm of breast: Secondary | ICD-10-CM

## 2018-01-27 ENCOUNTER — Emergency Department (HOSPITAL_BASED_OUTPATIENT_CLINIC_OR_DEPARTMENT_OTHER)
Admission: EM | Admit: 2018-01-27 | Discharge: 2018-01-27 | Disposition: A | Discharge status: Home / Self Care | Payer: PRIVATE HEALTH INSURANCE | Attending: Emergency Medicine | Admitting: Emergency Medicine

## 2018-01-27 ENCOUNTER — Encounter (HOSPITAL_BASED_OUTPATIENT_CLINIC_OR_DEPARTMENT_OTHER): Payer: Self-pay | Admitting: Emergency Medicine

## 2018-01-27 ENCOUNTER — Other Ambulatory Visit: Payer: Self-pay

## 2018-01-27 DIAGNOSIS — Y929 Unspecified place or not applicable: Secondary | ICD-10-CM | POA: Diagnosis not present

## 2018-01-27 DIAGNOSIS — Z79899 Other long term (current) drug therapy: Secondary | ICD-10-CM | POA: Diagnosis not present

## 2018-01-27 DIAGNOSIS — Y939 Activity, unspecified: Secondary | ICD-10-CM | POA: Insufficient documentation

## 2018-01-27 DIAGNOSIS — Y999 Unspecified external cause status: Secondary | ICD-10-CM | POA: Insufficient documentation

## 2018-01-27 DIAGNOSIS — S0003XA Contusion of scalp, initial encounter: Secondary | ICD-10-CM

## 2018-01-27 DIAGNOSIS — S098XXA Other specified injuries of head, initial encounter: Secondary | ICD-10-CM | POA: Diagnosis present

## 2018-01-27 DIAGNOSIS — W228XXA Striking against or struck by other objects, initial encounter: Secondary | ICD-10-CM | POA: Insufficient documentation
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
## 2018-11-04 ENCOUNTER — Other Ambulatory Visit: Payer: Self-pay | Admitting: Family Medicine

## 2018-11-04 ENCOUNTER — Other Ambulatory Visit: Payer: Self-pay | Admitting: Obstetrics and Gynecology

## 2018-11-04 DIAGNOSIS — Z1231 Encounter for screening mammogram for malignant neoplasm of breast: Secondary | ICD-10-CM

## 2018-12-03 ENCOUNTER — Ambulatory Visit: Payer: Self-pay

## 2018-12-26 ENCOUNTER — Ambulatory Visit: Payer: Self-pay

## 2019-01-20 ENCOUNTER — Ambulatory Visit: Payer: Self-pay

## 2019-06-11 ENCOUNTER — Ambulatory Visit: Payer: PRIVATE HEALTH INSURANCE
